# Patient Record
Sex: Female | Born: 2000 | Race: White | Hispanic: No | Marital: Single | Smoking: Never smoker
Health system: Southern US, Community
[De-identification: ages and names within clinical notes are randomized; demographics above are authoritative.]

## PROBLEM LIST (undated history)

## (undated) DIAGNOSIS — L709 Acne, unspecified: Secondary | ICD-10-CM

## (undated) DIAGNOSIS — Z23 Encounter for immunization: Secondary | ICD-10-CM

## (undated) HISTORY — DX: Acne, unspecified: L70.9

## (undated) HISTORY — DX: Encounter for immunization: Z23

---
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
## 2020-02-06 IMAGING — US US ABDOMEN LIMITED
1 series · 11 of 11 positions shown · non-contrast
Comparison: None.

CLINICAL DATA: Right-sided abdominal pain

EXAM:
ULTRASOUND ABDOMEN LIMITED
TECHNIQUE: Gray scale imaging of the right lower quadrant was performed to
evaluate for suspected appendicitis. Standard imaging planes and
graded compression technique were utilized.

[Series 1: us abdomen limited · 0.09mm/px · 11 of 11 slices shown]
[im 1/11]
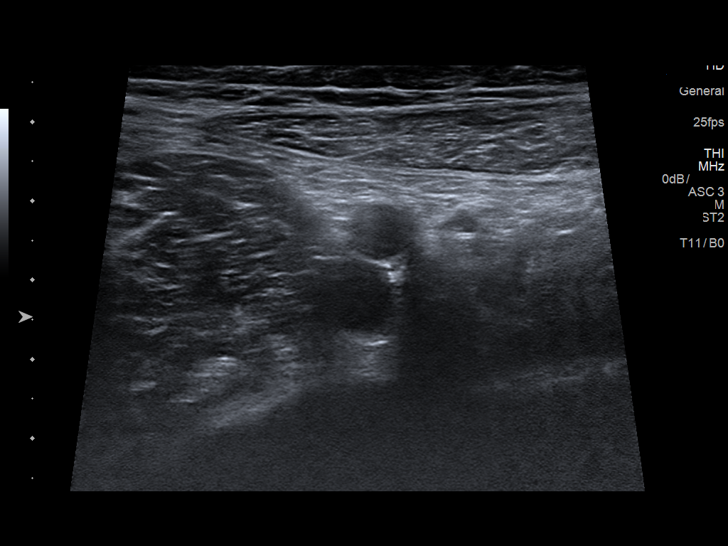
[im 2/11]
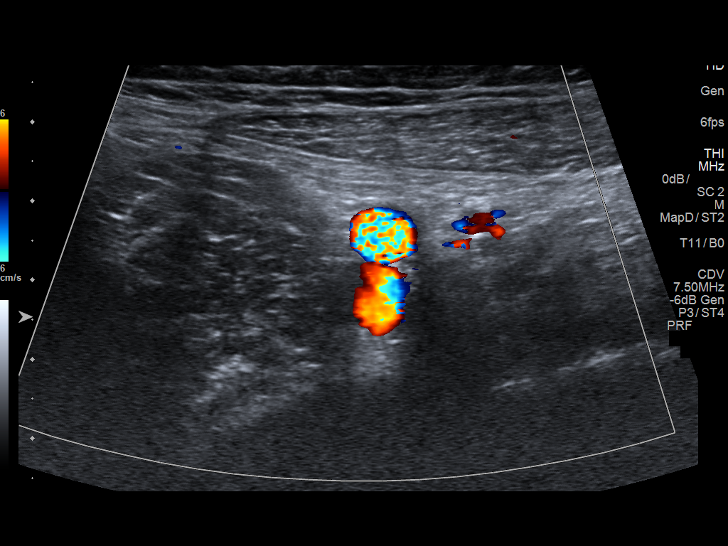
[im 3/11]
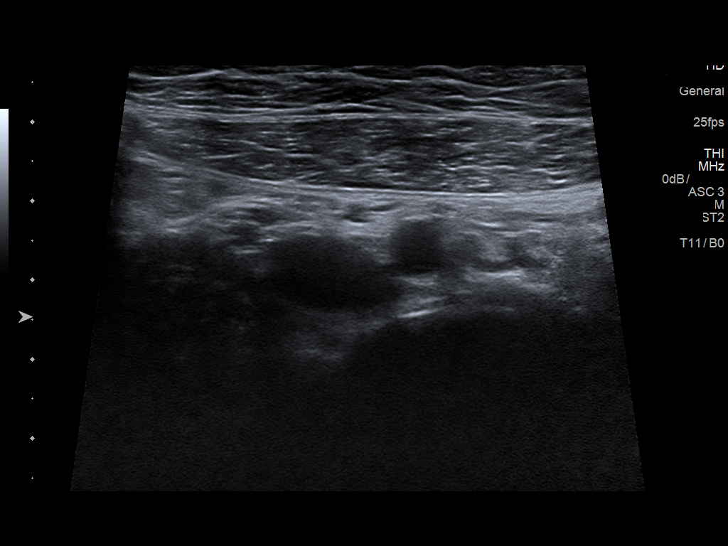
[im 4/11]
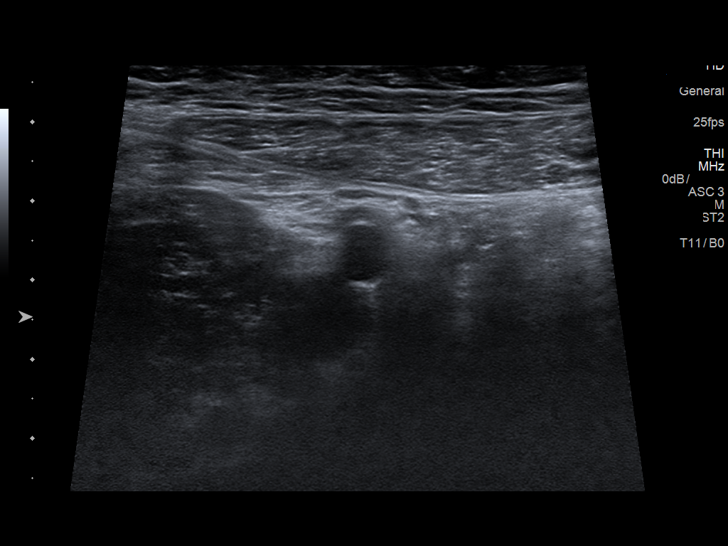
[im 5/11]
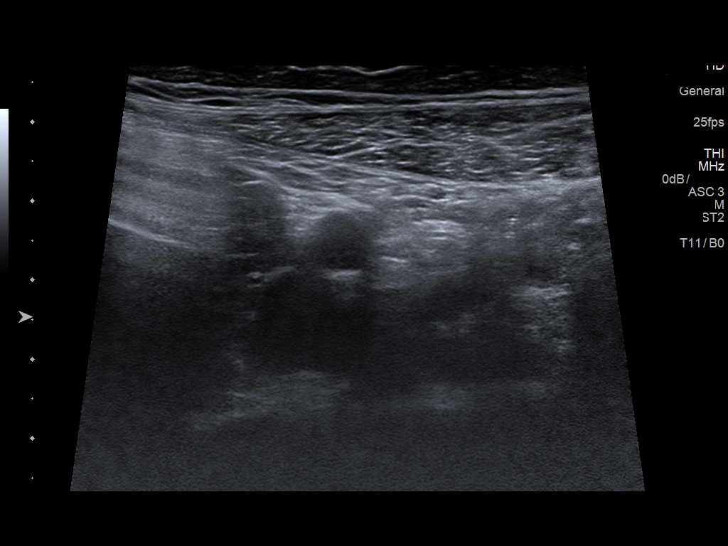
[im 6/11]
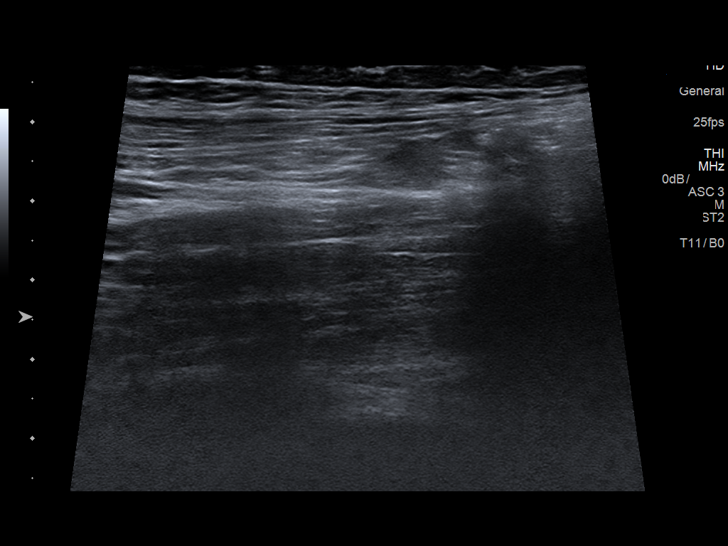
[im 7/11]
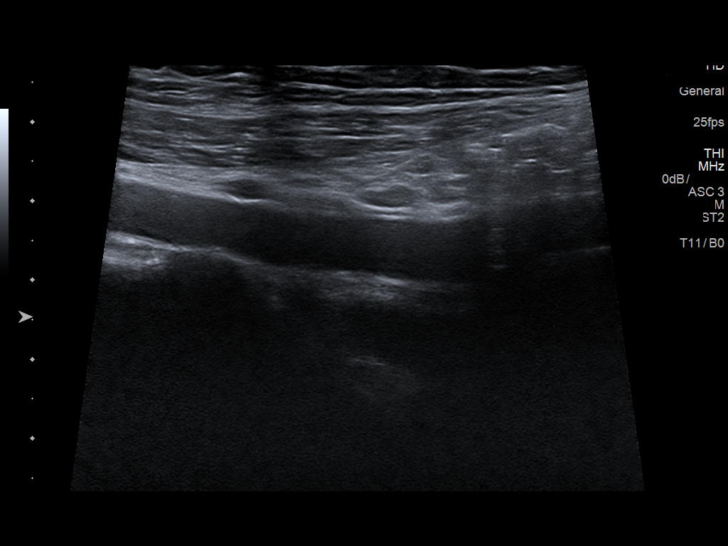
[im 8/11]
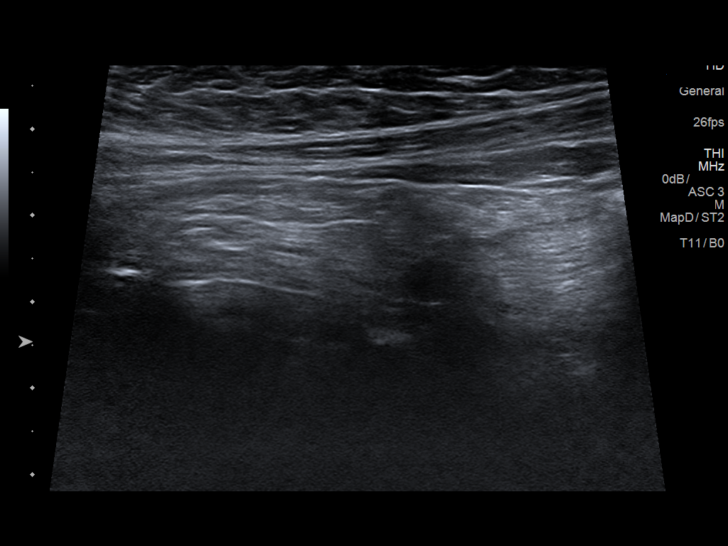
[im 9/11]
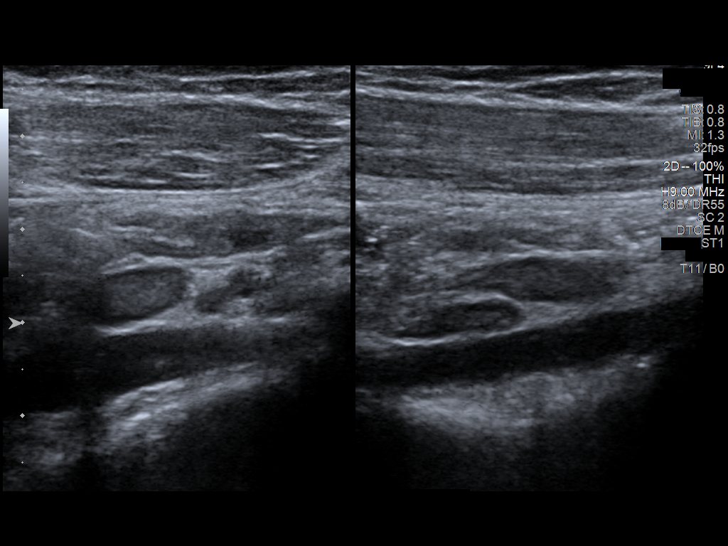
[im 10/11]
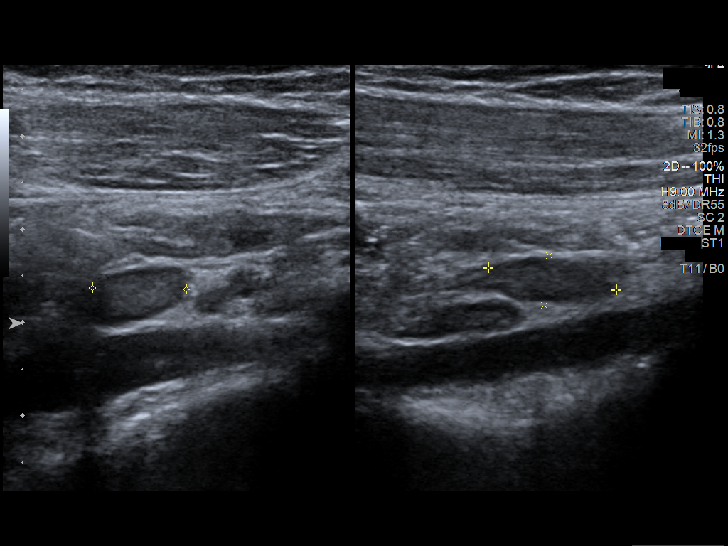
[im 11/11]
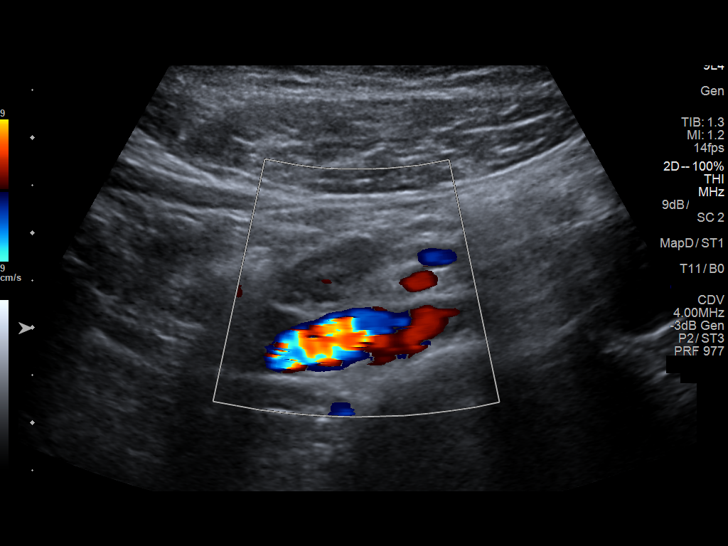

[11 of 11 positions shown; findings below may reference images not displayed]

FINDINGS: The appendix is not visualized.

Ancillary findings: Right lower quadrant mesenteric lymph nodes are
identified the largest approximately 1.4 x 0.5 x 1 cm. Mesenteric
adenitis is a possibility for the patient's pain.

Factors affecting image quality: None.
IMPRESSION: The appendix is not visualized. There are mildly enlarged right
lower quadrant lymph nodes identified, the largest approximately
x 0.5 x 1 cm that may reflect mesenteric adenitis.

Note: Non-visualization of appendix by US does not definitely
exclude appendicitis. If there is sufficient clinical concern,
consider abdomen pelvis CT with contrast for further evaluation.

## 2020-08-06 ENCOUNTER — Telehealth: Payer: Self-pay

## 2020-08-06 NOTE — Telephone Encounter (Signed)
Pt calling; recently found out she is preg; now cramping and has a period like bleeding; what to do?  [phone]  Pt took two preg tests yesterday - one positive, one negative; not time for period; period last month didn't last the normal time.  Adv. To take another preg test today; if neg consider this her period and track bleeding; if positive be seen either here or ED.

## 2020-10-27 ENCOUNTER — Other Ambulatory Visit: Payer: Self-pay

## 2020-10-27 ENCOUNTER — Ambulatory Visit
Admission: RE | Admit: 2020-10-27 | Discharge: 2020-10-27 | Disposition: A | Discharge status: Home / Self Care | Payer: BC Managed Care – PPO | Source: Ambulatory Visit | Attending: Emergency Medicine | Admitting: Emergency Medicine

## 2020-10-27 VITALS — BP 122/79 | HR 101 | Temp 98.6°F | Resp 18

## 2020-10-27 DIAGNOSIS — L02216 Cutaneous abscess of umbilicus: Secondary | ICD-10-CM

## 2020-10-27 DIAGNOSIS — W4904XA Ring or other jewelry causing external constriction, initial encounter: Secondary | ICD-10-CM | POA: Diagnosis not present

## 2020-10-27 DIAGNOSIS — L089 Local infection of the skin and subcutaneous tissue, unspecified: Secondary | ICD-10-CM

## 2020-10-27 MED ORDER — MUPIROCIN 2 % EX OINT: 1.0000 "application " | TOPICAL_OINTMENT | Freq: Two times a day (BID) | CUTANEOUS | 0 refills | Status: AC

## 2020-10-27 NOTE — ED Provider Notes (Signed)
Chief Complaint   Chief Complaint  Patient presents with   Rash     Subjective, HPI  Joy Armstrong is a very pleasant 20 y.o. female who presents with concern due to belly button piercing. Patient reports that she has had pus draining and swelling to the pierced area. Some discomfort with pressing the area directly. Patient received this piercing in January and has noticed this issue "off and on for the last couple of months". She reports concern for keloid as well. Has been cleaning with saline solution and used mupirocin once which was prescribed from a previous infected piercing. No fever, chills, vomiting, abdominal pain.  History obtained from patient.   Patient's problem list, past medical and social history, medications, and allergies were reviewed by me and updated in Epic.    ROS  See HPI.  Objective   Vitals:   10/27/20 0956  BP: 122/79  Pulse: (!) 101  Resp: 18  Temp: 98.6 F (37 C)  SpO2: 97%    Vital signs and nursing note reviewed.  General: Appears well-developed and well-nourished. No acute distress.  HEENT: Normocephalic, atraumatic, hearing grossly intact. EOMI, no drainage. No rhinorrhea. Moist mucous membranes.  Neck: Normal range of motion, neck is supple.  Cardiovascular: Normal rate.  Pulm/Chest: No respiratory distress.   Musculoskeletal: No joint deformity, normal range of motion.  Skin: Erythematous area to inferior umbilical piercing with purulent drainage.  There is also a small keloid forming in this area as well.  Mild TTP.  Data  No results found for any visits on 10/27/20.   Assessment & Plan  1. Pierced belly button infection  Meds ordered this encounter  Medications   mupirocin ointment (BACTROBAN) 2 %    Sig: Apply 1 application topically 2 (two) times daily for 10 days.    Dispense:  20 g    Refill:  0    Order Specific Question:   Supervising Provider    Answer:   Chase Picket [[id number]]     20 y.o. female presents with  concern due to belly button piercing. Patient reports that she has had pus draining and swelling to the pierced area. Some discomfort with pressing the area directly. Patient received this piercing in January and has noticed this issue "off and on for the last couple of months". She reports concern for keloid as well. Has been cleaning with saline solution and used mupirocin once which was prescribed from a previous infected piercing. No fever, chills, vomiting, abdominal pain.  Chart review completed.  Given symptoms along with assessment findings, likely infection of umbilical piercing.  Rx'd mupirocin to the patient's preferred pharmacy and advised about home treatment and care along with strict return precautions for worsening of symptoms.  Advised that she should consider removing the piercing to help with clearance of infection.  Patient verbalized understanding and agreed with plan.  Stable on discharge.  Plan:   Discharge Instructions      Keep the area clean and dry Clean with soap/water/saline solution Consider removing the piercing to prevent from worsening infection Return for fever, chills, vomiting, or worsening pain       10/27/20  This note was partially made with the aid of speech-to-text dictation; typographical errors are not intentional.    Joy Armstrong, [place] 10/27/20 1020

## 2020-10-27 NOTE — ED Triage Notes (Signed)
Pt presents today with request to have area around belly button ring checked. She does report swelling and soreness to area. Ring was inserted 6 months ago.

## 2020-10-27 NOTE — Discharge Instructions (Addendum)
Keep the area clean and dry Clean with soap/water/saline solution Consider removing the piercing to prevent from worsening infection Return for fever, chills, vomiting, or worsening pain
